# Patient Record
Sex: Female | Born: 1960 | Race: White | Hispanic: No | Marital: Single | Smoking: Never smoker
Health system: Southern US, Community
[De-identification: ages and names within clinical notes are randomized; demographics above are authoritative.]

## PROBLEM LIST (undated history)

## (undated) DIAGNOSIS — T7840XA Allergy, unspecified, initial encounter: Secondary | ICD-10-CM

## (undated) DIAGNOSIS — F410 Panic disorder [episodic paroxysmal anxiety] without agoraphobia: Secondary | ICD-10-CM

## (undated) HISTORY — DX: Allergy, unspecified, initial encounter: T78.40XA

## (undated) HISTORY — PX: COLONOSCOPY: SHX174

## (undated) HISTORY — PX: PARTIAL HYSTERECTOMY: SHX80

## (undated) HISTORY — DX: Panic disorder (episodic paroxysmal anxiety): F41.0

## (undated) HISTORY — PX: WISDOM TOOTH EXTRACTION: SHX21

---

## 2003-09-30 ENCOUNTER — Other Ambulatory Visit: Admission: RE | Admit: 2003-09-30 | Discharge: 2003-09-30 | Payer: Self-pay | Admitting: Obstetrics & Gynecology

## 2007-11-23 ENCOUNTER — Ambulatory Visit (HOSPITAL_COMMUNITY): Admission: RE | Admit: 2007-11-23 | Discharge: 2007-11-24 | Payer: Self-pay | Admitting: Obstetrics & Gynecology

## 2007-11-23 ENCOUNTER — Encounter (INDEPENDENT_AMBULATORY_CARE_PROVIDER_SITE_OTHER): Payer: Self-pay | Admitting: Obstetrics & Gynecology

## 2007-12-03 ENCOUNTER — Encounter (INDEPENDENT_AMBULATORY_CARE_PROVIDER_SITE_OTHER): Payer: Self-pay | Admitting: Obstetrics & Gynecology

## 2007-12-03 ENCOUNTER — Ambulatory Visit: Admission: RE | Admit: 2007-12-03 | Discharge: 2007-12-03 | Payer: Self-pay | Admitting: Obstetrics & Gynecology

## 2007-12-03 ENCOUNTER — Ambulatory Visit: Payer: Self-pay | Admitting: Vascular Surgery

## 2008-10-06 ENCOUNTER — Encounter: Admission: RE | Admit: 2008-10-06 | Discharge: 2008-10-06 | Payer: Self-pay | Admitting: Obstetrics and Gynecology

## 2009-10-12 ENCOUNTER — Encounter: Admission: RE | Admit: 2009-10-12 | Discharge: 2009-10-12 | Payer: Self-pay | Admitting: Obstetrics & Gynecology

## 2010-06-16 ENCOUNTER — Encounter (INDEPENDENT_AMBULATORY_CARE_PROVIDER_SITE_OTHER): Payer: Self-pay | Admitting: *Deleted

## 2010-08-20 ENCOUNTER — Ambulatory Visit: Payer: Self-pay | Admitting: Internal Medicine

## 2010-08-20 ENCOUNTER — Encounter (INDEPENDENT_AMBULATORY_CARE_PROVIDER_SITE_OTHER): Payer: Self-pay | Admitting: *Deleted

## 2010-09-09 ENCOUNTER — Ambulatory Visit: Payer: Self-pay | Admitting: Internal Medicine

## 2010-10-19 ENCOUNTER — Encounter: Admission: RE | Admit: 2010-10-19 | Discharge: 2010-10-19 | Payer: Self-pay | Admitting: Internal Medicine

## 2010-12-09 ENCOUNTER — Ambulatory Visit (HOSPITAL_COMMUNITY)
Admission: RE | Admit: 2010-12-09 | Discharge: 2010-12-09 | Payer: Self-pay | Source: Home / Self Care | Attending: Otolaryngology | Admitting: Otolaryngology

## 2010-12-16 NOTE — Miscellaneous (Signed)
Summary: [facility]  Clinical Lists Changes  Medications: Added new medication of MOVIPREP 100 GM  SOLR (PEG-KCL-NACL-NASULF-NA ASC-C) As per prep instructions. - Signed Rx of MOVIPREP 100 GM  SOLR (PEG-KCL-NACL-NASULF-NA ASC-C) As per prep instructions.;  #1 x 0;  Signed;  Entered by: Ezra Sites RN;  Authorized by: Hart Carwin MD;  Method used: Electronically to [facility] [place] [address] [phone]*, [address], [place], [place]  [address], Ph: [phone], Fax: [phone] Allergies: Added new allergy or adverse reaction of * DILAUDID Observations: Added new observation of NKA: F (08/20/2010 8:22)    Prescriptions: MOVIPREP 100 GM  SOLR (PEG-KCL-NACL-NASULF-NA ASC-C) As per prep instructions.  #1 x 0   Entered by:   Ezra Sites RN   Authorized by:   Hart Carwin MD   Signed by:   Ezra Sites RN on 08/20/2010   Method used:   Electronically to        [facility] [address]* (retail)       [address] [place], [place]  [address]       Ph: [phone]       Fax: [phone]   RxID:   [id number]

## 2010-12-16 NOTE — Letter (Signed)
Summary: Previsit letter  [facility]  [address] [place], [place] [address]   Phone: [phone]  Fax: [phone]       06/16/2010 MRN: [id number]  [name] [address] [place], [place]  [address]  Dear [name],  Welcome to the Gastroenterology Division at [facility].    You are scheduled to see a nurse for your pre-procedure visit on 08-16-10 at 9:30a.m. on the 3rd floor at [facility], [address]  We ask that you try to arrive at our office 15 minutes prior to your appointment time to allow for check-in.  Your nurse visit will consist of discussing your medical and surgical history, your immediate family medical history, and your medications.    Please bring a complete list of all your medications or, if you prefer, bring the medication bottles and we will list them.  We will need to be aware of both prescribed and over the counter drugs.  We will need to know exact dosage information as well.  If you are on blood thinners (Coumadin, Plavix, Aggrenox, Ticlid, etc.) please call our office today/prior to your appointment, as we need to consult with your physician about holding your medication.   Please be prepared to read and sign documents such as consent forms, a financial agreement, and acknowledgement forms.  If necessary, and with your consent, a friend or relative is welcome to sit-in on the nurse visit with you.  Please bring your insurance card so that we may make a copy of it.  If your insurance requires a referral to see a specialist, please bring your referral form from your primary care physician.  No co-pay is required for this nurse visit.     If you cannot keep your appointment, please call [phone] to cancel or reschedule prior to your appointment date.  This allows [place] the opportunity to schedule an appointment for another patient in need of care.    Thank you for choosing [facility] for your medical  needs.  We appreciate the opportunity to care for you.  Please visit [place] at our website  to learn more about our practice.                     Sincerely.                                                                                                                   The Gastroenterology Division

## 2010-12-16 NOTE — Letter (Signed)
Summary: [facility] Instructions  [facility]  [address] [place], [place] [address]   Phone: [phone]  Fax: [phone]       [name]    October 03, 1954    MRN: [id number]        Procedure Day /Date: Thursday 09/09/2010     Arrival Time: 3:00 pm      Procedure Time: 4:00 pm     Location of Procedure:                    _x _  [facility] (4th Floor)                        PREPARATION FOR COLONOSCOPY WITH MOVIPREP   Starting 5 days prior to your procedure Saturday 10/22 do not eat nuts, seeds, popcorn, corn, beans, peas,  salads, or any raw vegetables.  Do not take any fiber supplements (e.g. Metamucil, Citrucel, and Benefiber).  THE DAY BEFORE YOUR PROCEDURE         DATE: Wednesday 10/26  1.  Drink clear liquids the entire day-NO SOLID FOOD  2.  Do not drink anything colored red or purple.  Avoid juices with pulp.  No orange juice.  3.  Drink at least 64 oz. (8 glasses) of fluid/clear liquids during the day to prevent dehydration and help the prep work efficiently.  CLEAR LIQUIDS INCLUDE: Water Jello Ice Popsicles Tea (sugar ok, no milk/cream) Powdered fruit flavored drinks Coffee (sugar ok, no milk/cream) Gatorade Juice: apple, white grape, white cranberry  Lemonade Clear bullion, consomm, broth Carbonated beverages (any kind) Strained chicken noodle soup Hard Candy                             4.  In the morning, mix first dose of MoviPrep solution:    Empty 1 Pouch A and 1 Pouch B into the disposable container    Add lukewarm drinking water to the top line of the container. Mix to dissolve    Refrigerate (mixed solution should be used within 24 hrs)  5.  Begin drinking the prep at 5:00 p.m. The MoviPrep container is divided by 4 marks.   Every 15 minutes drink the solution down to the next mark (approximately 8 oz) until the full liter is complete.   6.  Follow completed prep with 16 oz of clear liquid of your choice  (Nothing red or purple).  Continue to drink clear liquids until bedtime.  7.  Before going to bed, mix second dose of MoviPrep solution:    Empty 1 Pouch A and 1 Pouch B into the disposable container    Add lukewarm drinking water to the top line of the container. Mix to dissolve    Refrigerate  THE DAY OF YOUR PROCEDURE      DATE: Thursday 20/27  Beginning at 11:00 a.m. (5 hours before procedure):         1. Every 15 minutes, drink the solution down to the next mark (approx 8 oz) until the full liter is complete.  2. Follow completed prep with 16 oz. of clear liquid of your choice.    3. You may drink clear liquids until 2:00 pm (2 HOURS BEFORE PROCEDURE).   MEDICATION INSTRUCTIONS  Unless otherwise instructed, you should take regular prescription medications with a small sip of water   as early as possible the morning of your  procedure.        OTHER INSTRUCTIONS  You will need a responsible adult at least 51 years of age to accompany you and drive you home.   This person must remain in the waiting room during your procedure.  Wear loose fitting clothing that is easily removed.  Leave jewelry and other valuables at home.  However, you may wish to bring a book to read or  an iPod/MP3 player to listen to music as you wait for your procedure to start.  Remove all body piercing jewelry and leave at home.  Total time from sign-in until discharge is approximately 2-3 hours.  You should go home directly after your procedure and rest.  You can resume normal activities the  day after your procedure.  The day of your procedure you should not:   Drive   Make legal decisions   Operate machinery   Drink alcohol   Return to work  You will receive specific instructions about eating, activities and medications before you leave.    The above instructions have been reviewed and explained to me by   Ezra Sites RN  August 20, 2010 8:50 AM    I fully understand and  can verbalize these instructions _____________________________ Date _________

## 2010-12-16 NOTE — Procedures (Signed)
Summary: Colonoscopy  Patient: [name] Note: All result statuses are Final unless otherwise noted.  Tests: (1) Colonoscopy (COL)   COL Colonoscopy           DONE     [facility]     [address]     [place], [place]  [address]           COLONOSCOPY PROCEDURE REPORT           PATIENT:  [name], [name]  MR#:  [id number]     BIRTHDATE:  [birth year], 49 yrs. old  GENDER:  female     ENDOSCOPIST:  Hedwig Morton. Juanda Chance, MD     REF. BY:  Creola Corn, M.D.     PROCEDURE DATE:  09/09/2010     PROCEDURE:  Colonoscopy [address]     ASA CLASS:  Class I     INDICATIONS:  Routine Risk Screening     MEDICATIONS:   Versed 10 mg, Fentanyl 100 mcg           DESCRIPTION OF PROCEDURE:   After the risks benefits and     alternatives of the procedure were thoroughly explained, informed     consent was obtained.  Digital rectal exam was performed and     revealed no rectal masses.   The LB PCF-H180AL [id number] endoscope     was introduced through the anus and advanced to the cecum, which     was identified by both the appendix and ileocecal valve, without     limitations.  The quality of the prep was excellent, using     MoviPrep.  The instrument was then slowly withdrawn as the colon     was fully examined.     <<PROCEDUREIMAGES>>           FINDINGS:  No polyps or cancers were seen (see image1, image2,     image4, image3, image5, image7, and image8).   Retroflexed views     in the rectum revealed no abnormalities.    The scope was then     withdrawn from the patient and the procedure completed.           COMPLICATIONS:  None     ENDOSCOPIC IMPRESSION:     1) No polyps or cancers     2) Normal colonoscopy     prediverticular changes left colon     RECOMMENDATIONS:     1) high fiber diet     REPEAT EXAM:  In 10 year(s) for.           ______________________________     Hedwig Morton. Juanda Chance, MD           CC:           n.     eSIGNED:   Hedwig Morton. Brodie at 09/09/2010 04:22 PM        Doreatha Massed, [id number]  Note: An exclamation mark (!) indicates a result that was not dispersed into the flowsheet. Document Creation Date: 09/09/2010 4:23 PM _______________________________________________________________________  (1) Order result status: Final Collection or observation date-time: 09/09/2010 16:16 Requested date-time:  Receipt date-time:  Reported date-time:  Referring Physician:   Ordering Physician: Lina Sar [phone]) Specimen Source:  Source: Launa Grill Order Number: [id number] Lab site:   Appended Document: Colonoscopy    Clinical Lists Changes  Observations: Added new observation of COLONNXTDUE: 08/2020 (09/09/2010 16:32)

## 2011-03-29 NOTE — Op Note (Signed)
[name], [name]            ACCOUNT NO.:  [id number]   MEDICAL RECORD NO.:  [id number]          PATIENT TYPE:  AMB   LOCATION:  DAY                          FACILITY:  [facility]   PHYSICIAN:  Genia Del, M.D.DATE OF BIRTH:  [birth year]   DATE OF PROCEDURE:  11/23/2007  DATE OF DISCHARGE:                               OPERATIVE REPORT   PREOPERATIVE DIAGNOSIS:  Large symptomatic uterine myomas with  menorrhagia, anemia and pelvic pains.   POSTOPERATIVE DIAGNOSES:  1. Large symptomatic uterine myomas with menorrhagia, anemia and      pelvic pains.  2. Mild pelvic endometriosis.   PROCEDURE:  Total laparoscopy hysterectomy assisted with Engineer, building services robot.   SURGEON:  Genia Del, MD   ASSISTANT:  Alphonsus Sias. Ernestina Penna, MD   ANESTHESIOLOGIST:  Hezzie Bump. Rose, MD   PROCEDURE:  Under general anesthesia with endotracheal intubation, the  patient is in lithotomy position.  She is prepped with Betadine in the  abdominal, suprapubic, vulvar and vaginal areas and draped as usual.  The Foley is put in place.  The vaginal exam reveals an anteverted  uterus, mobile but very large, measuring at least 14-15 cm.  No adnexal  mass felt.  We insert the co-ring with the RUMI as usual without  difficulty.  We then go abdominally, measure 20 cm above the fundus of  the uterus, and put the camera at that level.  The Hasson is inserted as  usual.  We then create a pneumoperitoneum with CO2.  We measure the  other port sites and use a W configuration with the assistant port on  the right side.  We then dock the robot easily, instruments are  inserted, an Endoshear scissors in the right arm, the bipolar  fenestrated in the left arm, and the third robotic arm has the Cobra  clamp.  We then go to robotic time at the console.  We start on the left  side.  We cauterize and section the left round ligament, the left tube  and the left utero-ovarian ligament.  We continue to the lateral side of  the  uterus, cauterizing and sectioning successively.  The left ureter is  in normal anatomic position.  We see some mild lesions of endometriosis  in the posterior cul-de-sac towards the left uterosacral ligament.  We  then open the visceral peritoneum anteriorly and descend the bladder  downward.  We proceed exactly the same way on the right side and  descending the bladder further.  We then cauterize and section the right  uterine artery and then the left uterine artery.  The uterus is  blanching.  We then push the co-ring further in and open the superior  vaginal cuff anteriorly and continue all around the top of the co-ring  to the sides and posteriorly.  The uterus is completely detached this  way and the co-ring with the RUMI are removed.  The uterus is then put  in the left gutter and we proceed with closure of the vaginal cuff.  The  instruments are changed.  We use a cutting needle driver in the right  hand,  a normal needle driver in the left hand and the fenestrated  bipolar in the third robotic arm.  We close the vaginal cuff with figure-  of-eights with Vicryl 0, no difficulty, and the cuff is well-closed, no  bleeding.  Both ovaries were normal in size and appearance and were  preserved.  We then suction the pelvic cavity.  We confirm that  hemostasis is good at all levels.  We therefore remove robotic  instruments.  We undock the robot and flatten the patient slightly.  We  then use the morcellator and morcellate the uterus.  This is done easily  as the texture of the uterus is rather soft.  We weigh the uterus.  It  is 552 g.  It is then sent to pathology.  We irrigate and suction the  abdominopelvic cavity.  We remove all instruments and all ports under  direct vision, evacuate the CO2.  We then attach the suture at the  aponeurosis where the camera port was.  We then close the skin at all  incisions with Vicryl 4-0 and reapproximate the skin with Dermabond at  all incisions.   The occluder that was put in the vagina to preserve the  pneumoperitoneum is removed.  Hemostasis is adequate at all levels.  The  estimated blood loss was 100 mL.  The patient had received Ancef 1 g IV  at induction.  No complication occurred.  The count of instruments and  sponges was complete x2.  The patient was brought to recovery room in  good, stable status.      Genia Del, M.D.  Electronically Signed     ML/MEDQ  D:  11/23/2007  T:  11/23/2007  Job:  [id number]

## 2011-08-04 LAB — CBC
HCT: 29 — ABNORMAL LOW
HCT: 35.4 — ABNORMAL LOW
Hemoglobin: 10.1 — ABNORMAL LOW
Hemoglobin: 12.2
MCHC: 34.6
Platelets: 189
RDW: 14.5
WBC: 3 — ABNORMAL LOW

## 2011-08-04 LAB — PREGNANCY, URINE: Preg Test, Ur: NEGATIVE

## 2011-09-13 ENCOUNTER — Other Ambulatory Visit: Payer: Self-pay | Admitting: Internal Medicine

## 2011-09-13 DIAGNOSIS — Z1231 Encounter for screening mammogram for malignant neoplasm of breast: Secondary | ICD-10-CM

## 2011-10-12 ENCOUNTER — Other Ambulatory Visit (HOSPITAL_COMMUNITY): Payer: Self-pay | Admitting: Internal Medicine

## 2011-10-12 DIAGNOSIS — R0789 Other chest pain: Secondary | ICD-10-CM

## 2011-10-17 ENCOUNTER — Ambulatory Visit (HOSPITAL_COMMUNITY)
Admission: RE | Admit: 2011-10-17 | Discharge: 2011-10-17 | Disposition: A | Discharge status: Home / Self Care | Payer: Commercial Managed Care - PPO | Source: Ambulatory Visit | Attending: Internal Medicine | Admitting: Internal Medicine

## 2011-10-17 DIAGNOSIS — R0789 Other chest pain: Secondary | ICD-10-CM | POA: Insufficient documentation

## 2011-10-24 ENCOUNTER — Ambulatory Visit
Admission: RE | Admit: 2011-10-24 | Discharge: 2011-10-24 | Disposition: A | Discharge status: Home / Self Care | Payer: Commercial Managed Care - PPO | Source: Ambulatory Visit | Attending: Internal Medicine | Admitting: Internal Medicine

## 2011-10-24 DIAGNOSIS — Z1231 Encounter for screening mammogram for malignant neoplasm of breast: Secondary | ICD-10-CM

## 2011-11-01 ENCOUNTER — Other Ambulatory Visit: Payer: Self-pay | Admitting: Internal Medicine

## 2011-11-01 DIAGNOSIS — R1011 Right upper quadrant pain: Secondary | ICD-10-CM

## 2011-11-02 ENCOUNTER — Ambulatory Visit
Admission: RE | Admit: 2011-11-02 | Discharge: 2011-11-02 | Disposition: A | Discharge status: Home / Self Care | Payer: Commercial Managed Care - PPO | Source: Ambulatory Visit | Attending: Internal Medicine | Admitting: Internal Medicine

## 2011-11-02 DIAGNOSIS — R1011 Right upper quadrant pain: Secondary | ICD-10-CM

## 2012-11-08 ENCOUNTER — Other Ambulatory Visit: Payer: Self-pay | Admitting: Internal Medicine

## 2012-11-08 DIAGNOSIS — Z1231 Encounter for screening mammogram for malignant neoplasm of breast: Secondary | ICD-10-CM

## 2012-12-05 ENCOUNTER — Ambulatory Visit
Admission: RE | Admit: 2012-12-05 | Discharge: 2012-12-05 | Disposition: A | Discharge status: Home / Self Care | Payer: 59 | Source: Ambulatory Visit | Attending: Internal Medicine | Admitting: Internal Medicine

## 2012-12-05 DIAGNOSIS — Z1231 Encounter for screening mammogram for malignant neoplasm of breast: Secondary | ICD-10-CM

## 2012-12-10 ENCOUNTER — Other Ambulatory Visit: Payer: Self-pay | Admitting: Internal Medicine

## 2012-12-10 DIAGNOSIS — R928 Other abnormal and inconclusive findings on diagnostic imaging of breast: Secondary | ICD-10-CM

## 2012-12-19 ENCOUNTER — Ambulatory Visit
Admission: RE | Admit: 2012-12-19 | Discharge: 2012-12-19 | Disposition: A | Discharge status: Home / Self Care | Payer: 59 | Source: Ambulatory Visit | Attending: Internal Medicine | Admitting: Internal Medicine

## 2012-12-19 DIAGNOSIS — R928 Other abnormal and inconclusive findings on diagnostic imaging of breast: Secondary | ICD-10-CM

## 2013-07-05 ENCOUNTER — Other Ambulatory Visit: Payer: Self-pay | Admitting: Internal Medicine

## 2013-07-05 DIAGNOSIS — R922 Inconclusive mammogram: Secondary | ICD-10-CM

## 2013-08-01 ENCOUNTER — Ambulatory Visit
Admission: RE | Admit: 2013-08-01 | Discharge: 2013-08-01 | Disposition: A | Discharge status: Home / Self Care | Payer: 59 | Source: Ambulatory Visit | Attending: Internal Medicine | Admitting: Internal Medicine

## 2013-08-01 DIAGNOSIS — R922 Inconclusive mammogram: Secondary | ICD-10-CM

## 2014-01-07 ENCOUNTER — Other Ambulatory Visit: Payer: Self-pay | Admitting: Internal Medicine

## 2014-01-07 ENCOUNTER — Other Ambulatory Visit: Payer: Self-pay

## 2014-01-07 DIAGNOSIS — N6489 Other specified disorders of breast: Secondary | ICD-10-CM

## 2014-01-27 ENCOUNTER — Ambulatory Visit
Admission: RE | Admit: 2014-01-27 | Discharge: 2014-01-27 | Disposition: A | Discharge status: Home / Self Care | Payer: 59 | Source: Ambulatory Visit | Attending: Internal Medicine | Admitting: Internal Medicine

## 2014-01-27 DIAGNOSIS — N6489 Other specified disorders of breast: Secondary | ICD-10-CM

## 2014-12-22 ENCOUNTER — Ambulatory Visit: Payer: Self-pay | Admitting: Podiatry

## 2015-01-12 ENCOUNTER — Other Ambulatory Visit: Payer: Self-pay | Admitting: Dermatology

## 2015-03-31 ENCOUNTER — Other Ambulatory Visit: Payer: Self-pay

## 2015-03-31 DIAGNOSIS — Z1231 Encounter for screening mammogram for malignant neoplasm of breast: Secondary | ICD-10-CM

## 2015-05-11 ENCOUNTER — Ambulatory Visit: Payer: Self-pay

## 2015-06-23 ENCOUNTER — Ambulatory Visit
Admission: RE | Admit: 2015-06-23 | Discharge: 2015-06-23 | Disposition: A | Discharge status: Home / Self Care | Payer: Managed Care, Other (non HMO) | Source: Ambulatory Visit

## 2015-06-23 DIAGNOSIS — Z1231 Encounter for screening mammogram for malignant neoplasm of breast: Secondary | ICD-10-CM

## 2015-11-25 ENCOUNTER — Encounter: Payer: Self-pay | Admitting: Internal Medicine

## 2016-05-27 ENCOUNTER — Other Ambulatory Visit: Payer: Self-pay | Admitting: Internal Medicine

## 2016-05-27 DIAGNOSIS — Z1231 Encounter for screening mammogram for malignant neoplasm of breast: Secondary | ICD-10-CM

## 2016-06-29 ENCOUNTER — Ambulatory Visit
Admission: RE | Admit: 2016-06-29 | Discharge: 2016-06-29 | Disposition: A | Discharge status: Home / Self Care | Payer: Commercial Managed Care - HMO | Source: Ambulatory Visit | Attending: Internal Medicine | Admitting: Internal Medicine

## 2016-06-29 DIAGNOSIS — Z1231 Encounter for screening mammogram for malignant neoplasm of breast: Secondary | ICD-10-CM

## 2017-05-16 DIAGNOSIS — Z0000 Encounter for general adult medical examination without abnormal findings: Secondary | ICD-10-CM | POA: Diagnosis not present

## 2017-05-22 ENCOUNTER — Other Ambulatory Visit: Payer: Self-pay | Admitting: Internal Medicine

## 2017-05-22 DIAGNOSIS — Z1231 Encounter for screening mammogram for malignant neoplasm of breast: Secondary | ICD-10-CM

## 2017-05-23 DIAGNOSIS — Z0000 Encounter for general adult medical examination without abnormal findings: Secondary | ICD-10-CM | POA: Diagnosis not present

## 2017-07-04 ENCOUNTER — Ambulatory Visit
Admission: RE | Admit: 2017-07-04 | Discharge: 2017-07-04 | Disposition: A | Discharge status: Home / Self Care | Payer: 59 | Source: Ambulatory Visit | Attending: Internal Medicine | Admitting: Internal Medicine

## 2017-07-04 DIAGNOSIS — Z1231 Encounter for screening mammogram for malignant neoplasm of breast: Secondary | ICD-10-CM

## 2017-07-05 ENCOUNTER — Ambulatory Visit: Payer: Commercial Managed Care - HMO

## 2017-09-06 DIAGNOSIS — Z23 Encounter for immunization: Secondary | ICD-10-CM | POA: Diagnosis not present

## 2017-10-18 DIAGNOSIS — H5203 Hypermetropia, bilateral: Secondary | ICD-10-CM | POA: Diagnosis not present

## 2017-10-18 DIAGNOSIS — H524 Presbyopia: Secondary | ICD-10-CM | POA: Diagnosis not present

## 2017-10-18 DIAGNOSIS — H52203 Unspecified astigmatism, bilateral: Secondary | ICD-10-CM | POA: Diagnosis not present

## 2018-02-13 DIAGNOSIS — D225 Melanocytic nevi of trunk: Secondary | ICD-10-CM | POA: Diagnosis not present

## 2018-02-13 DIAGNOSIS — Z86018 Personal history of other benign neoplasm: Secondary | ICD-10-CM | POA: Diagnosis not present

## 2018-02-13 DIAGNOSIS — D1801 Hemangioma of skin and subcutaneous tissue: Secondary | ICD-10-CM | POA: Diagnosis not present

## 2018-02-13 DIAGNOSIS — L570 Actinic keratosis: Secondary | ICD-10-CM | POA: Diagnosis not present

## 2018-08-11 DIAGNOSIS — Z23 Encounter for immunization: Secondary | ICD-10-CM | POA: Diagnosis not present

## 2018-09-04 ENCOUNTER — Other Ambulatory Visit: Payer: Self-pay | Admitting: Internal Medicine

## 2018-09-04 DIAGNOSIS — Z1231 Encounter for screening mammogram for malignant neoplasm of breast: Secondary | ICD-10-CM

## 2018-10-17 ENCOUNTER — Ambulatory Visit
Admission: RE | Admit: 2018-10-17 | Discharge: 2018-10-17 | Disposition: A | Discharge status: Home / Self Care | Payer: 59 | Source: Ambulatory Visit | Attending: Internal Medicine | Admitting: Internal Medicine

## 2018-10-17 DIAGNOSIS — Z1231 Encounter for screening mammogram for malignant neoplasm of breast: Secondary | ICD-10-CM

## 2018-10-31 DIAGNOSIS — H52203 Unspecified astigmatism, bilateral: Secondary | ICD-10-CM | POA: Diagnosis not present

## 2018-10-31 DIAGNOSIS — H5203 Hypermetropia, bilateral: Secondary | ICD-10-CM | POA: Diagnosis not present

## 2018-10-31 DIAGNOSIS — H524 Presbyopia: Secondary | ICD-10-CM | POA: Diagnosis not present

## 2019-10-02 ENCOUNTER — Other Ambulatory Visit: Payer: Self-pay

## 2019-10-02 DIAGNOSIS — Z20822 Contact with and (suspected) exposure to covid-19: Secondary | ICD-10-CM

## 2019-10-03 LAB — NOVEL CORONAVIRUS, NAA: SARS-CoV-2, NAA: NOT DETECTED

## 2020-04-14 IMAGING — MG DIGITAL SCREENING BILATERAL MAMMOGRAM WITH TOMO AND CAD
6 of 10 series · 6 of 30 positions shown · non-contrast
Comparison: Previous exam(s).

CLINICAL DATA: Screening.

EXAM:
DIGITAL SCREENING BILATERAL MAMMOGRAM WITH TOMO AND CAD

[R CC synth-2D]
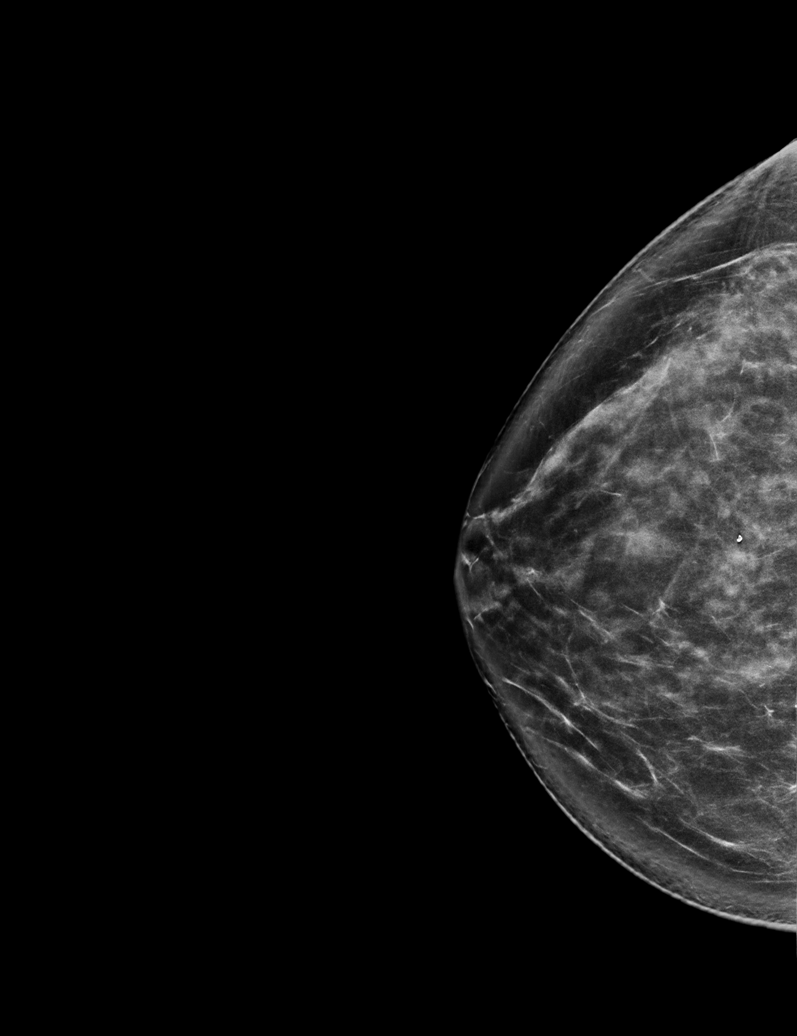

[L CC synth-2D]
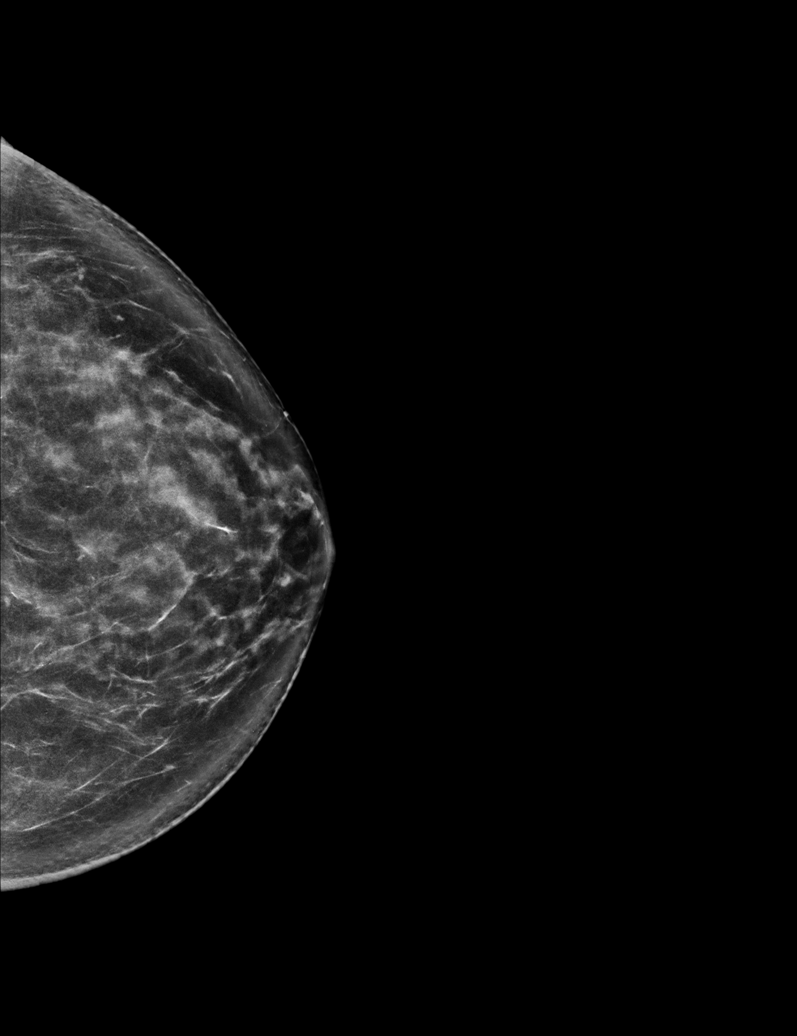

[R MLO synth-2D (1 of 2)]
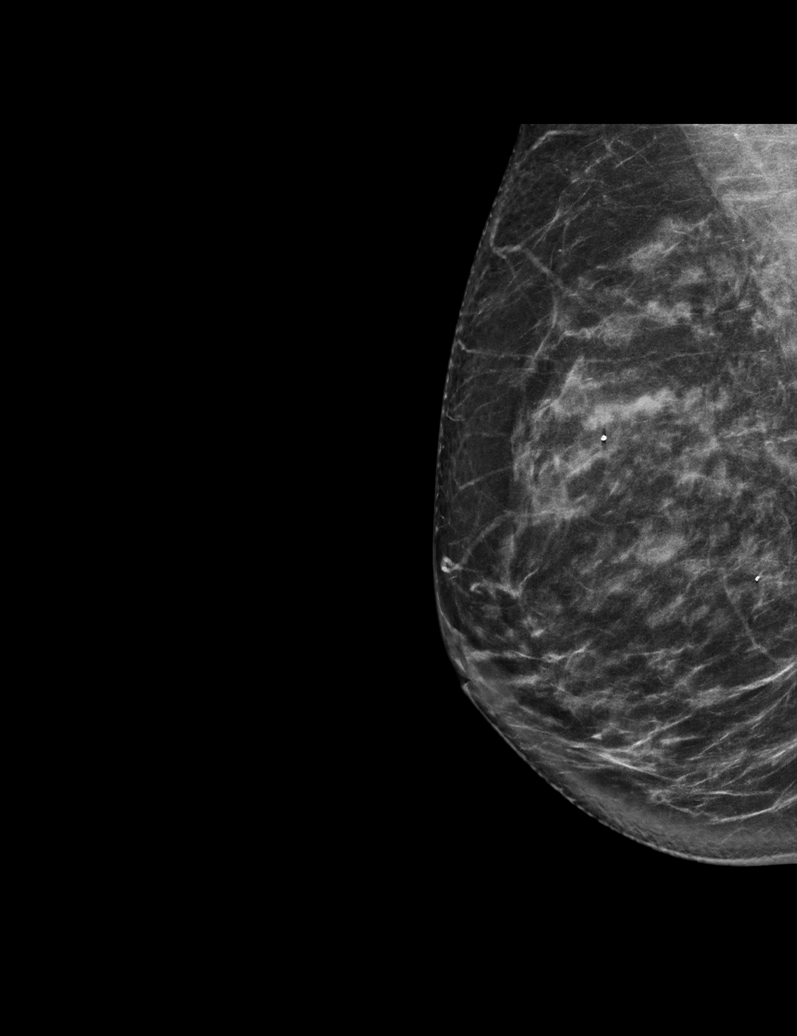

[R MLO synth-2D (2 of 2)]
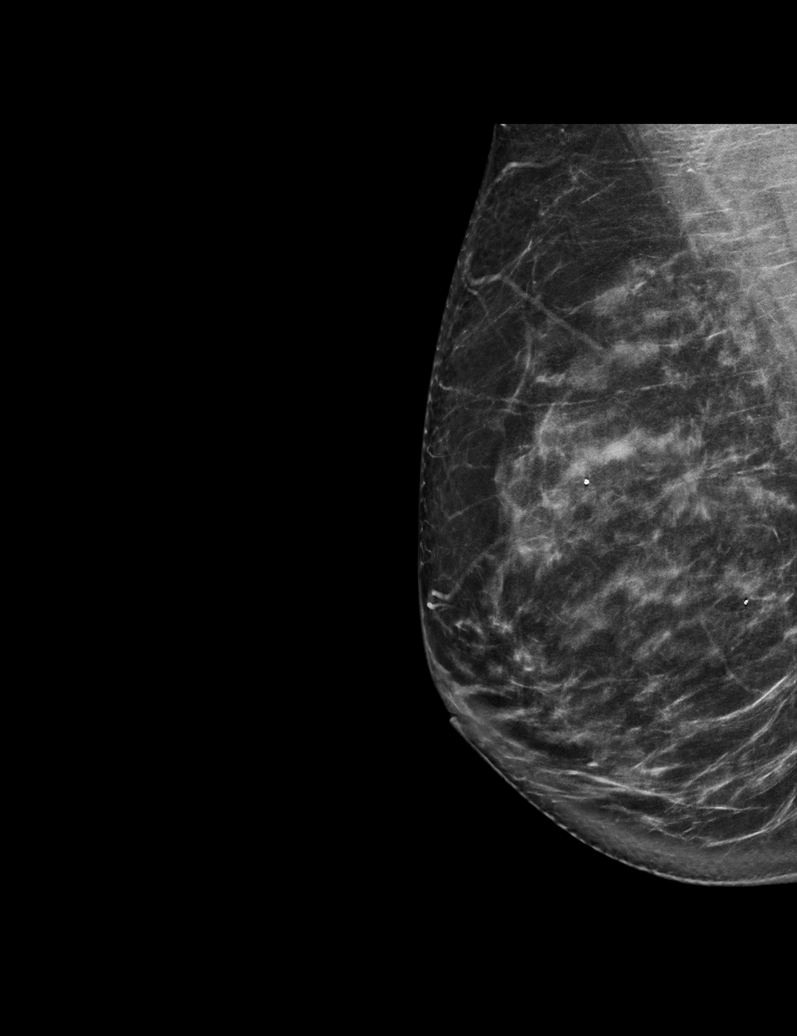

[L MLO synth-2D]
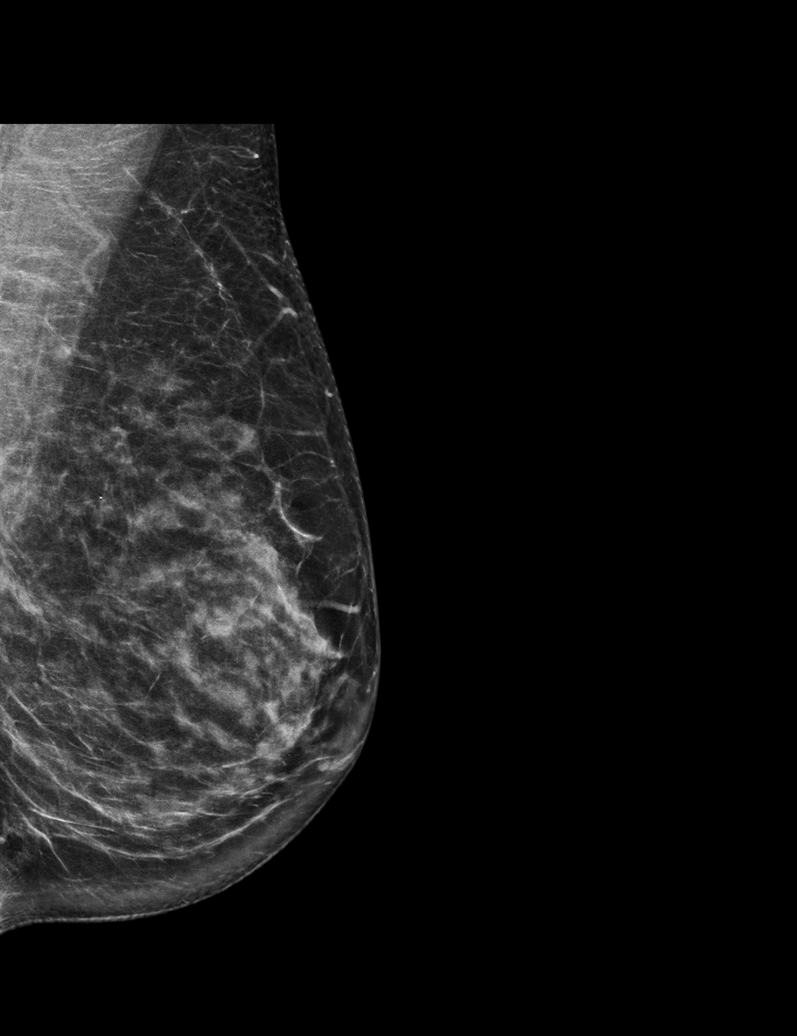

[R MLO tomo · tomo slice 35/68.0]
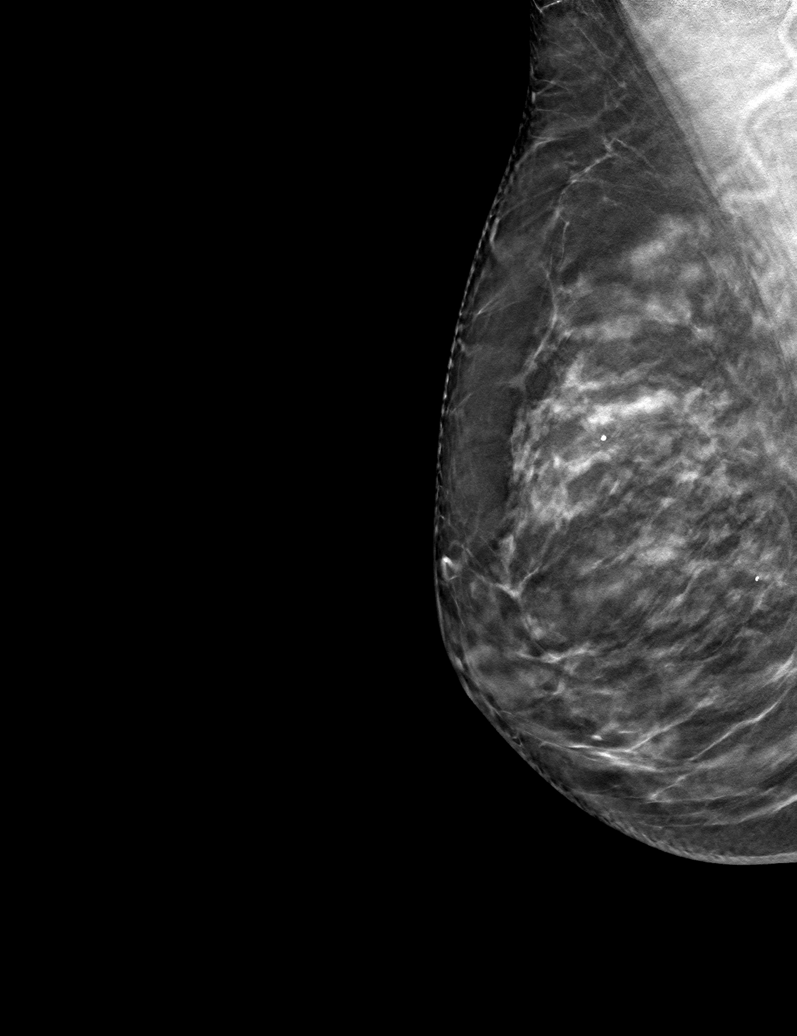

[6 of 30 positions shown; findings below may reference images not displayed]

ACR Breast Density Category c: The breast tissue is heterogeneously
dense, which may obscure small masses.
FINDINGS: There are no findings suspicious for malignancy. Images were
processed with CAD.
IMPRESSION: No mammographic evidence of malignancy. A result letter of this
screening mammogram will be mailed directly to the patient.

RECOMMENDATION:
Screening mammogram in one year. (Code:[id number]-[id number])

BI-RADS CATEGORY  1: Negative.

## 2020-09-23 ENCOUNTER — Other Ambulatory Visit: Payer: Self-pay | Admitting: Internal Medicine

## 2020-09-23 DIAGNOSIS — Z1231 Encounter for screening mammogram for malignant neoplasm of breast: Secondary | ICD-10-CM

## 2020-11-04 ENCOUNTER — Ambulatory Visit
Admission: RE | Admit: 2020-11-04 | Discharge: 2020-11-04 | Disposition: A | Discharge status: Home / Self Care | Payer: 59 | Source: Ambulatory Visit | Attending: Internal Medicine | Admitting: Internal Medicine

## 2020-11-04 ENCOUNTER — Other Ambulatory Visit: Payer: Self-pay

## 2020-11-04 DIAGNOSIS — Z1231 Encounter for screening mammogram for malignant neoplasm of breast: Secondary | ICD-10-CM

## 2020-12-28 ENCOUNTER — Encounter: Payer: Self-pay | Admitting: Gastroenterology

## 2021-03-16 ENCOUNTER — Encounter: Payer: Self-pay | Admitting: Gastroenterology

## 2021-04-29 ENCOUNTER — Encounter: Payer: 59 | Admitting: Gastroenterology

## 2021-05-13 ENCOUNTER — Other Ambulatory Visit: Payer: Self-pay

## 2021-05-13 ENCOUNTER — Ambulatory Visit (AMBULATORY_SURGERY_CENTER): Payer: 59 | Admitting: *Deleted

## 2021-05-13 VITALS — Ht 64.0 in | Wt 145.0 lb

## 2021-05-13 DIAGNOSIS — Z1211 Encounter for screening for malignant neoplasm of colon: Secondary | ICD-10-CM

## 2021-05-13 MED ORDER — CLENPIQ 10-3.5-12 MG-GM -GM/160ML PO SOLN: 1.0000 | ORAL | 0 refills | Status: DC

## 2021-05-13 NOTE — Progress Notes (Signed)
Pt verified name, DOB, address and insurance during PV today. Pt mailed instruction packet to included paper to complete and mail back to [facility] with addressed and stamped envelope, Emmi video, copy of consent form to read and not return, and instructions. clenpiq coupon mailed in packet. PV completed over the phone. Pt encouraged to call with questions or issues. My Chart instructions to pt as well    No egg or soy allergy known to patient  No issues with past sedation with any surgeries or procedures Patient denies ever being told they had issues or difficulty with intubation  No FH of Malignant Hyperthermia No diet pills per patient No home 02 use per patient  No blood thinners per patient  Pt denies issues with constipation  No A fib or A flutter  EMMI video to pt or via [place] 19 guidelines implemented in [place] today with Pt and RN  Pt is fully vaccinated  for [employer] given to pt in PV today , Code to Pharmacy and  NO PA's for preps discussed with pt In PV today  Discussed with pt there will be an out-of-pocket cost for prep and that varies from $0 to 70 dollars   Due to the COVID-19 pandemic we are asking patients to follow certain guidelines.  Pt aware of COVID protocols and [facility] guidelines

## 2021-05-26 ENCOUNTER — Telehealth: Payer: Self-pay | Admitting: Gastroenterology

## 2021-05-26 ENCOUNTER — Telehealth: Payer: Self-pay | Admitting: *Deleted

## 2021-05-26 NOTE — Telephone Encounter (Signed)
Okay I think fine to proceed with colonoscopy if she otherwise is feeling up for it in regards to recovery from her dental procedure.  Some please contact her to let her know.  Thanks

## 2021-05-26 NOTE — Telephone Encounter (Signed)
Patient calling wants to inform she will be having dental surgery (tooth) 05/27/21/ will be on antibiotics.  She asks can she still have colon procedure 05/28/21.  Plz advise   thank you

## 2021-05-26 NOTE — Telephone Encounter (Signed)
Pt is having a dental procedure tomorrow and taking abx. Wanted to know if this was ok to do prior to her colonoscopy tomorrow. Assured her this would be fine.

## 2021-05-27 NOTE — Telephone Encounter (Signed)
Called patient back and left her a message explaining that MD said it was fine to proceed with her colonoscopy the day after her dental procedure as long as she felt up to it. I asked her to let us know if she would be changing this procedure on her voicemail.

## 2021-05-28 ENCOUNTER — Encounter: Payer: Self-pay | Admitting: Gastroenterology

## 2021-05-28 ENCOUNTER — Other Ambulatory Visit: Payer: Self-pay

## 2021-05-28 ENCOUNTER — Ambulatory Visit (AMBULATORY_SURGERY_CENTER): Payer: 59 | Admitting: Gastroenterology

## 2021-05-28 VITALS — BP 131/57 | HR 58 | Temp 97.5°F | Resp 13 | Ht 64.0 in | Wt 145.0 lb

## 2021-05-28 DIAGNOSIS — Z1211 Encounter for screening for malignant neoplasm of colon: Secondary | ICD-10-CM | POA: Diagnosis present

## 2021-05-28 DIAGNOSIS — D123 Benign neoplasm of transverse colon: Secondary | ICD-10-CM

## 2021-05-28 MED ORDER — SODIUM CHLORIDE 0.9 % IV SOLN: 500.0000 mL | Freq: Once | INTRAVENOUS | Status: DC

## 2021-05-28 NOTE — Progress Notes (Signed)
A and O x3. Report to RN. Tolerated MAC anesthesia well. 

## 2021-05-28 NOTE — Progress Notes (Signed)
Called to room to assist during endoscopic procedure.  Patient ID and intended procedure confirmed with present staff. Received instructions for my participation in the procedure from the performing physician.  

## 2021-05-28 NOTE — Progress Notes (Signed)
C.W. vital signs. 

## 2021-05-28 NOTE — Op Note (Signed)
[facility] Patient Name: [name] Procedure Date: 05/28/2021 9:23 AM MRN: [id number] Endoscopist: Remo Lipps P. Havery Moros , MD Age: 60 Referring MD:  Date of Birth: 1961-03-09 Gender: Female Account #: [id number] Procedure:                Colonoscopy Indications:              Screening for colorectal malignant neoplasm Medicines:                Monitored Anesthesia Care Procedure:                Pre-Anesthesia Assessment:                           - Prior to the procedure, a History and Physical                            was performed, and patient medications and                            allergies were reviewed. The patient's tolerance of                            previous anesthesia was also reviewed. The risks                            and benefits of the procedure and the sedation                            options and risks were discussed with the patient.                            All questions were answered, and informed consent                            was obtained. Prior Anticoagulants: The patient has                            taken no previous anticoagulant or antiplatelet                            agents. ASA Grade Assessment: II - A patient with                            mild systemic disease. After reviewing the risks                            and benefits, the patient was deemed in                            satisfactory condition to undergo the procedure.                           After obtaining informed consent, the colonoscope  was passed under direct vision. Throughout the                            procedure, the patient's blood pressure, pulse, and                            oxygen saturations were monitored continuously. The                            Olympus PCF-[id number]) Colonoscope was                            introduced through the anus and advanced to the the                            cecum,  identified by appendiceal orifice and                            ileocecal valve. The colonoscopy was performed                            without difficulty. The patient tolerated the                            procedure well. The quality of the bowel                            preparation was adequate. The ileocecal valve,                            appendiceal orifice, and rectum were photographed. Scope In: 9:31:12 AM Scope Out: 9:49:29 AM Scope Withdrawal Time: 0 hours 15 minutes 3 seconds  Total Procedure Duration: 0 hours 18 minutes 17 seconds  Findings:                 The perianal and digital rectal examinations were                            normal.                           A 4 mm polyp was found in the transverse colon. The                            polyp was sessile. The polyp was removed with a                            cold snare. Resection and retrieval were complete.                           Scattered medium-mouthed diverticula were found in                            the transverse colon and left colon.  The exam was otherwise without abnormality. Complications:            No immediate complications. Estimated blood loss:                            Minimal. Estimated Blood Loss:     Estimated blood loss was minimal. Impression:               - One 4 mm polyp in the transverse colon, removed                            with a cold snare. Resected and retrieved.                           - Diverticulosis in the transverse colon and in the                            left colon.                           - The examination was otherwise normal. Recommendation:           - Patient has a contact number available for                            emergencies. The signs and symptoms of potential                            delayed complications were discussed with the                            patient. Return to normal activities tomorrow.                             Written discharge instructions were provided to the                            patient.                           - Resume previous diet.                           - Continue present medications.                           - Await pathology results. Remo Lipps P. Emin Foree, MD 05/28/2021 9:52:23 AM This report has been signed electronically.

## 2021-05-28 NOTE — Progress Notes (Signed)
Pt's states no medical or surgical changes since previsit or office visit. 

## 2021-05-28 NOTE — Patient Instructions (Signed)
Handouts on polyps & diverticulosis given to you today  Await pathology results on polyp removed today   YOU HAD AN ENDOSCOPIC PROCEDURE TODAY AT [facility]:   Refer to the procedure report that was given to you for any specific questions about what was found during the examination.  If the procedure report does not answer your questions, please call your gastroenterologist to clarify.  If you requested that your care partner not be given the details of your procedure findings, then the procedure report has been included in a sealed envelope for you to review at your convenience later.  YOU SHOULD EXPECT: Some feelings of bloating in the abdomen. Passage of more gas than usual.  Walking can help get rid of the air that was put into your GI tract during the procedure and reduce the bloating. If you had a lower endoscopy (such as a colonoscopy or flexible sigmoidoscopy) you may notice spotting of blood in your stool or on the toilet paper. If you underwent a bowel prep for your procedure, you may not have a normal bowel movement for a few days.  Please Note:  You might notice some irritation and congestion in your nose or some drainage.  This is from the oxygen used during your procedure.  There is no need for concern and it should clear up in a day or so.  SYMPTOMS TO REPORT IMMEDIATELY:  Following lower endoscopy (colonoscopy or flexible sigmoidoscopy):  Excessive amounts of blood in the stool  Significant tenderness or worsening of abdominal pains  Swelling of the abdomen that is new, acute  Fever of 100F or higher  For urgent or emergent issues, a gastroenterologist can be reached at any hour by calling [phone]. Do not use MyChart messaging for urgent concerns.    DIET:  We do recommend a small meal at first, but then you may proceed to your regular diet.  Drink plenty of fluids but you should avoid alcoholic beverages for 24 hours.  ACTIVITY:  You should plan  to take it easy for the rest of today and you should NOT DRIVE or use heavy machinery until tomorrow (because of the sedation medicines used during the test).    FOLLOW UP: Our staff will call the number listed on your records 48-72 hours following your procedure to check on you and address any questions or concerns that you may have regarding the information given to you following your procedure. If we do not reach you, we will leave a message.  We will attempt to reach you two times.  During this call, we will ask if you have developed any symptoms of COVID 19. If you develop any symptoms (ie: fever, flu-like symptoms, shortness of breath, cough etc.) before then, please call [phone].  If you test positive for Covid 19 in the 2 weeks post procedure, please call and report this information to [place].    If any biopsies were taken you will be contacted by phone or by letter within the next 1-3 weeks.  Please call us at [phone] if you have not heard about the biopsies in 3 weeks.    SIGNATURES/CONFIDENTIALITY: You and/or your care partner have signed paperwork which will be entered into your electronic medical record.  These signatures attest to the fact that that the information above on your After Visit Summary has been reviewed and is understood.  Full responsibility of the confidentiality of this discharge information lies with you and/or your care-partner.

## 2021-06-01 ENCOUNTER — Telehealth: Payer: Self-pay | Admitting: *Deleted

## 2021-06-01 NOTE — Telephone Encounter (Signed)
Attempted 2nd f/u phone call. No answer. Left message.  °

## 2021-06-01 NOTE — Telephone Encounter (Signed)
Message left

## 2022-01-05 ENCOUNTER — Other Ambulatory Visit: Payer: Self-pay | Admitting: Internal Medicine

## 2022-01-05 DIAGNOSIS — Z0000 Encounter for general adult medical examination without abnormal findings: Secondary | ICD-10-CM

## 2022-02-10 ENCOUNTER — Ambulatory Visit
Admission: RE | Admit: 2022-02-10 | Discharge: 2022-02-10 | Disposition: A | Discharge status: Home / Self Care | Payer: No Typology Code available for payment source | Source: Ambulatory Visit | Attending: Internal Medicine | Admitting: Internal Medicine

## 2022-02-10 DIAGNOSIS — Z0000 Encounter for general adult medical examination without abnormal findings: Secondary | ICD-10-CM

## 2024-10-31 ENCOUNTER — Ambulatory Visit: Admitting: Podiatry

## 2024-10-31 ENCOUNTER — Encounter: Payer: Self-pay | Admitting: Podiatry

## 2024-10-31 DIAGNOSIS — L600 Ingrowing nail: Secondary | ICD-10-CM | POA: Diagnosis not present

## 2024-10-31 DIAGNOSIS — B351 Tinea unguium: Secondary | ICD-10-CM | POA: Diagnosis not present

## 2024-10-31 NOTE — Patient Instructions (Signed)

## 2024-10-31 NOTE — Progress Notes (Unsigned)
 Medial perm

## 2024-11-11 ENCOUNTER — Ambulatory Visit: Payer: Self-pay | Admitting: Podiatry
